# Patient Record
Sex: Female | Born: 1950 | Race: Black or African American | Hispanic: No | Marital: Married | Smoking: Never smoker
Health system: Southern US, Community
[De-identification: ages and names within clinical notes are randomized; demographics above are authoritative.]

## PROBLEM LIST (undated history)

## (undated) DIAGNOSIS — M1990 Unspecified osteoarthritis, unspecified site: Secondary | ICD-10-CM

## (undated) DIAGNOSIS — I10 Essential (primary) hypertension: Secondary | ICD-10-CM

---

## 2005-03-18 ENCOUNTER — Ambulatory Visit: Payer: Self-pay

## 2005-03-26 ENCOUNTER — Ambulatory Visit: Payer: Self-pay

## 2006-06-29 ENCOUNTER — Ambulatory Visit: Payer: Self-pay

## 2007-08-08 ENCOUNTER — Ambulatory Visit: Payer: Self-pay | Admitting: Internal Medicine

## 2007-08-30 ENCOUNTER — Ambulatory Visit: Payer: Self-pay | Admitting: Internal Medicine

## 2008-10-09 ENCOUNTER — Emergency Department: Payer: Self-pay | Admitting: Unknown Physician Specialty

## 2008-10-25 ENCOUNTER — Ambulatory Visit: Payer: Self-pay | Admitting: Internal Medicine

## 2008-12-25 ENCOUNTER — Ambulatory Visit: Payer: Self-pay | Admitting: Rheumatology

## 2009-12-17 ENCOUNTER — Ambulatory Visit: Payer: Self-pay | Admitting: Family Medicine

## 2010-02-10 ENCOUNTER — Ambulatory Visit: Payer: Self-pay | Admitting: Family Medicine

## 2010-12-31 ENCOUNTER — Ambulatory Visit: Payer: Self-pay | Admitting: Family Medicine

## 2012-10-07 ENCOUNTER — Ambulatory Visit: Payer: Self-pay | Admitting: Family Medicine

## 2014-07-30 ENCOUNTER — Other Ambulatory Visit: Payer: Self-pay | Admitting: Family Medicine

## 2014-07-30 ENCOUNTER — Other Ambulatory Visit: Payer: Self-pay

## 2014-07-30 DIAGNOSIS — Z1231 Encounter for screening mammogram for malignant neoplasm of breast: Secondary | ICD-10-CM

## 2014-08-28 ENCOUNTER — Ambulatory Visit
Admission: RE | Admit: 2014-08-28 | Discharge: 2014-08-28 | Disposition: A | Discharge status: Home / Self Care | Payer: No Typology Code available for payment source | Source: Ambulatory Visit

## 2014-08-28 DIAGNOSIS — Z1231 Encounter for screening mammogram for malignant neoplasm of breast: Secondary | ICD-10-CM | POA: Insufficient documentation

## 2014-08-29 ENCOUNTER — Other Ambulatory Visit: Payer: Self-pay | Admitting: Family Medicine

## 2016-09-15 ENCOUNTER — Ambulatory Visit
Admission: RE | Admit: 2016-09-15 | Discharge: 2016-09-15 | Disposition: A | Discharge status: Home / Self Care | Payer: Medicare HMO | Source: Ambulatory Visit | Attending: Family Medicine | Admitting: Family Medicine

## 2016-09-15 ENCOUNTER — Other Ambulatory Visit: Payer: Self-pay | Admitting: Family Medicine

## 2016-09-15 DIAGNOSIS — M7989 Other specified soft tissue disorders: Secondary | ICD-10-CM | POA: Insufficient documentation

## 2016-09-15 DIAGNOSIS — R52 Pain, unspecified: Secondary | ICD-10-CM

## 2016-09-15 DIAGNOSIS — M25571 Pain in right ankle and joints of right foot: Secondary | ICD-10-CM | POA: Insufficient documentation

## 2017-06-25 ENCOUNTER — Other Ambulatory Visit: Payer: Self-pay | Admitting: Family Medicine

## 2017-06-25 DIAGNOSIS — Z1231 Encounter for screening mammogram for malignant neoplasm of breast: Secondary | ICD-10-CM

## 2017-06-30 ENCOUNTER — Ambulatory Visit
Admission: RE | Admit: 2017-06-30 | Discharge: 2017-06-30 | Disposition: A | Discharge status: Home / Self Care | Payer: Medicare HMO | Source: Ambulatory Visit | Attending: Family Medicine | Admitting: Family Medicine

## 2017-06-30 DIAGNOSIS — Z1231 Encounter for screening mammogram for malignant neoplasm of breast: Secondary | ICD-10-CM | POA: Diagnosis present

## 2018-06-03 ENCOUNTER — Other Ambulatory Visit: Payer: Self-pay | Admitting: Family Medicine

## 2018-06-03 DIAGNOSIS — Z1231 Encounter for screening mammogram for malignant neoplasm of breast: Secondary | ICD-10-CM

## 2018-07-04 ENCOUNTER — Ambulatory Visit: Payer: Medicare HMO

## 2018-08-23 ENCOUNTER — Ambulatory Visit: Payer: Medicare HMO

## 2018-09-02 ENCOUNTER — Ambulatory Visit
Admission: RE | Admit: 2018-09-02 | Discharge: 2018-09-02 | Disposition: A | Discharge status: Home / Self Care | Payer: Medicare HMO | Source: Ambulatory Visit | Attending: Family Medicine | Admitting: Family Medicine

## 2018-09-02 ENCOUNTER — Other Ambulatory Visit: Payer: Self-pay

## 2018-09-02 DIAGNOSIS — Z1231 Encounter for screening mammogram for malignant neoplasm of breast: Secondary | ICD-10-CM | POA: Diagnosis not present

## 2018-11-12 ENCOUNTER — Other Ambulatory Visit: Payer: Self-pay

## 2018-11-12 DIAGNOSIS — Z20822 Contact with and (suspected) exposure to covid-19: Secondary | ICD-10-CM

## 2018-11-13 LAB — NOVEL CORONAVIRUS, NAA: SARS-CoV-2, NAA: NOT DETECTED

## 2019-03-29 ENCOUNTER — Ambulatory Visit: Payer: Medicare HMO | Attending: Internal Medicine

## 2019-03-29 DIAGNOSIS — Z20822 Contact with and (suspected) exposure to covid-19: Secondary | ICD-10-CM

## 2019-03-30 LAB — NOVEL CORONAVIRUS, NAA: SARS-CoV-2, NAA: NOT DETECTED

## 2019-08-22 ENCOUNTER — Other Ambulatory Visit: Payer: Self-pay | Admitting: Family Medicine

## 2019-08-22 DIAGNOSIS — Z1231 Encounter for screening mammogram for malignant neoplasm of breast: Secondary | ICD-10-CM

## 2019-09-05 ENCOUNTER — Ambulatory Visit
Admission: RE | Admit: 2019-09-05 | Discharge: 2019-09-05 | Disposition: A | Discharge status: Home / Self Care | Payer: Medicare HMO | Source: Ambulatory Visit | Attending: Family Medicine | Admitting: Family Medicine

## 2019-09-05 ENCOUNTER — Other Ambulatory Visit: Payer: Self-pay

## 2019-09-05 DIAGNOSIS — Z1231 Encounter for screening mammogram for malignant neoplasm of breast: Secondary | ICD-10-CM | POA: Diagnosis present

## 2019-12-27 HISTORY — PX: TOTAL KNEE ARTHROPLASTY: SHX125

## 2020-08-14 ENCOUNTER — Other Ambulatory Visit: Payer: Self-pay | Admitting: Family Medicine

## 2020-08-14 DIAGNOSIS — Z1231 Encounter for screening mammogram for malignant neoplasm of breast: Secondary | ICD-10-CM

## 2020-09-05 ENCOUNTER — Ambulatory Visit
Admission: RE | Admit: 2020-09-05 | Discharge: 2020-09-05 | Disposition: A | Discharge status: Home / Self Care | Payer: Medicare HMO | Source: Ambulatory Visit | Attending: Family Medicine | Admitting: Family Medicine

## 2020-09-05 ENCOUNTER — Other Ambulatory Visit: Payer: Self-pay

## 2020-09-05 DIAGNOSIS — Z1231 Encounter for screening mammogram for malignant neoplasm of breast: Secondary | ICD-10-CM | POA: Insufficient documentation

## 2020-10-08 ENCOUNTER — Other Ambulatory Visit: Payer: Self-pay

## 2020-10-29 DIAGNOSIS — D649 Anemia, unspecified: Secondary | ICD-10-CM | POA: Insufficient documentation

## 2020-10-29 DIAGNOSIS — K219 Gastro-esophageal reflux disease without esophagitis: Secondary | ICD-10-CM | POA: Insufficient documentation

## 2020-10-29 DIAGNOSIS — Z9851 Tubal ligation status: Secondary | ICD-10-CM | POA: Insufficient documentation

## 2020-10-29 DIAGNOSIS — I10 Essential (primary) hypertension: Secondary | ICD-10-CM | POA: Insufficient documentation

## 2020-10-30 ENCOUNTER — Telehealth (INDEPENDENT_AMBULATORY_CARE_PROVIDER_SITE_OTHER): Payer: Self-pay | Admitting: Gastroenterology

## 2020-10-30 DIAGNOSIS — Z1211 Encounter for screening for malignant neoplasm of colon: Secondary | ICD-10-CM

## 2020-10-30 MED ORDER — PEG 3350-KCL-NA BICARB-NACL 420 G PO SOLR: 4000.0000 mL | Freq: Once | ORAL | 0 refills | Status: AC

## 2020-10-30 NOTE — Progress Notes (Signed)
Gastroenterology Pre-Procedure Review  Request Date: 11/21/2020 Requesting Physician: Dr. Marius Ditch  PATIENT REVIEW QUESTIONS: The patient responded to the following health history questions as indicated:    1. Are you having any GI issues? no 2. Do you have a personal history of Polyps? Unsure  3. Do you have a family history of Colon Cancer or Polyps? no 4. Diabetes Mellitus? yes ( ) 5. Joint replacements in the past 12 months?yes right knee  6. Major health problems in the past 3 months?no 7. Any artificial heart valves, MVP, or defibrillator?no    MEDICATIONS & ALLERGIES:    Patient reports the following regarding taking any anticoagulation/antiplatelet therapy:   Plavix, Coumadin, Eliquis, Xarelto, Lovenox, Pradaxa, Brilinta, or Effient? yes (xarelto 10 mg ) Aspirin? no  Patient confirms/reports the following medications:  Current Outpatient Medications  Medication Sig Dispense Refill   chlorthalidone (HYGROTON) 50 MG tablet 50 mg in the morning and at bedtime.     erythromycin ophthalmic ointment erythromycin 5 mg/gram (0.5 %) eye ointment  INSTILL OINTMENT INTO RIGHT EYE ONCE DAILY FOR 7 DAYS     famotidine (PEPCID) 20 MG tablet famotidine 20 mg tablet  TAKE 1 TABLET BY MOUTH TWICE DAILY     meloxicam (MOBIC) 15 MG tablet meloxicam 15 mg tablet     ondansetron (ZOFRAN-ODT) 4 MG disintegrating tablet ondansetron 4 mg disintegrating tablet  DISSOLVE 1 TABLET IN MOUTH EVERY 6 HOURS AS NEEDED FOR NAUSEA AND VOMITING     oxyCODONE (OXY IR/ROXICODONE) 5 MG immediate release tablet oxycodone 5 mg tablet     rivaroxaban (XARELTO) 10 MG TABS tablet Take 10 mg by mouth once.     No current facility-administered medications for this visit.    Patient confirms/reports the following allergies:  Allergies  Allergen Reactions   Aspirin Anaphylaxis   Penicillins     No orders of the defined types were placed in this encounter.   AUTHORIZATION INFORMATION Primary  Insurance: 1D#: Group #:  Secondary Insurance: 1D#: Group #:  SCHEDULE INFORMATION: Date: 11/21/2020 Time: Location:[facility]

## 2020-10-30 NOTE — Progress Notes (Deleted)
scre

## 2020-11-01 ENCOUNTER — Telehealth: Payer: Self-pay

## 2020-11-01 NOTE — Telephone Encounter (Signed)
Contacted patient regarding the blood thinner request. Per Dr. Clemmie Krill, patient can stop taking Xarelto 2 days prior to procedure and restart 1 day after. Pt verbalized understanding.

## 2020-11-13 ENCOUNTER — Encounter: Payer: Self-pay | Admitting: Gastroenterology

## 2020-11-21 ENCOUNTER — Ambulatory Visit
Admission: RE | Admit: 2020-11-21 | Discharge: 2020-11-21 | Disposition: A | Discharge status: Home / Self Care | Payer: Medicare HMO | Attending: Gastroenterology | Admitting: Gastroenterology

## 2020-11-21 ENCOUNTER — Encounter: Payer: Self-pay | Admitting: Gastroenterology

## 2020-11-21 ENCOUNTER — Other Ambulatory Visit: Payer: Self-pay

## 2020-11-21 ENCOUNTER — Encounter
Admission: RE | Disposition: A | Discharge status: Home / Self Care | Payer: Self-pay | Source: Home / Self Care | Attending: Gastroenterology

## 2020-11-21 ENCOUNTER — Ambulatory Visit: Payer: Medicare HMO | Admitting: Anesthesiology

## 2020-11-21 DIAGNOSIS — Z87892 Personal history of anaphylaxis: Secondary | ICD-10-CM | POA: Diagnosis not present

## 2020-11-21 DIAGNOSIS — Z880 Allergy status to penicillin: Secondary | ICD-10-CM | POA: Insufficient documentation

## 2020-11-21 DIAGNOSIS — Z7982 Long term (current) use of aspirin: Secondary | ICD-10-CM | POA: Insufficient documentation

## 2020-11-21 DIAGNOSIS — Z791 Long term (current) use of non-steroidal anti-inflammatories (NSAID): Secondary | ICD-10-CM | POA: Insufficient documentation

## 2020-11-21 DIAGNOSIS — D124 Benign neoplasm of descending colon: Secondary | ICD-10-CM | POA: Insufficient documentation

## 2020-11-21 DIAGNOSIS — K644 Residual hemorrhoidal skin tags: Secondary | ICD-10-CM | POA: Insufficient documentation

## 2020-11-21 DIAGNOSIS — Z79899 Other long term (current) drug therapy: Secondary | ICD-10-CM | POA: Insufficient documentation

## 2020-11-21 DIAGNOSIS — K635 Polyp of colon: Secondary | ICD-10-CM | POA: Diagnosis not present

## 2020-11-21 DIAGNOSIS — Z886 Allergy status to analgesic agent status: Secondary | ICD-10-CM | POA: Insufficient documentation

## 2020-11-21 DIAGNOSIS — Z1211 Encounter for screening for malignant neoplasm of colon: Secondary | ICD-10-CM | POA: Insufficient documentation

## 2020-11-21 HISTORY — DX: Unspecified osteoarthritis, unspecified site: M19.90

## 2020-11-21 HISTORY — PX: COLONOSCOPY WITH PROPOFOL: SHX5780

## 2020-11-21 HISTORY — DX: Essential (primary) hypertension: I10

## 2020-11-21 SURGERY — COLONOSCOPY WITH PROPOFOL
Anesthesia: General

## 2020-11-21 MED ORDER — SODIUM CHLORIDE 0.9 % IV SOLN: INTRAVENOUS | Status: DC

## 2020-11-21 MED ORDER — STERILE WATER FOR IRRIGATION IR SOLN: Status: DC | PRN

## 2020-11-21 MED ORDER — PROPOFOL 10 MG/ML IV BOLUS
INTRAVENOUS | Status: DC | PRN
  Administered 2020-11-21: 40 mg via INTRAVENOUS
  Administered 2020-11-21: 120 mg via INTRAVENOUS
  Administered 2020-11-21 (×3): 40 mg via INTRAVENOUS

## 2020-11-21 MED ORDER — LIDOCAINE HCL (CARDIAC) PF 100 MG/5ML IV SOSY
PREFILLED_SYRINGE | INTRAVENOUS | Status: DC | PRN
  Administered 2020-11-21: 30 mg via INTRAVENOUS

## 2020-11-21 MED ORDER — ACETAMINOPHEN 325 MG PO TABS: 325.0000 mg | ORAL_TABLET | ORAL | Status: DC | PRN

## 2020-11-21 MED ORDER — LACTATED RINGERS IV SOLN: INTRAVENOUS | Status: DC

## 2020-11-21 MED ORDER — ACETAMINOPHEN 160 MG/5ML PO SOLN: 325.0000 mg | ORAL | Status: DC | PRN

## 2020-11-21 MED ORDER — ONDANSETRON HCL 4 MG/2ML IJ SOLN: 4.0000 mg | Freq: Once | INTRAMUSCULAR | Status: DC | PRN

## 2020-11-21 SURGICAL SUPPLY — 26 items
CLIP HMST 235XBRD CATH ROT (MISCELLANEOUS) IMPLANT
CLIP RESOLUTION 360 11X235 (MISCELLANEOUS)
ELECT REM PT RETURN 9FT ADLT (ELECTROSURGICAL)
ELECTRODE REM PT RTRN 9FT ADLT (ELECTROSURGICAL) IMPLANT
FCP ESCP3.2XJMB 240X2.8X (MISCELLANEOUS)
FORCEPS BIOP RAD 4 LRG CAP 4 (CUTTING FORCEPS) IMPLANT
FORCEPS BIOP RJ4 240 W/NDL (MISCELLANEOUS)
FORCEPS ESCP3.2XJMB 240X2.8X (MISCELLANEOUS) IMPLANT
GOWN CVR UNV OPN BCK APRN NK (MISCELLANEOUS) ×2 IMPLANT
GOWN ISOL THUMB LOOP REG UNIV (MISCELLANEOUS) ×4
INJECTOR VARIJECT VIN23 (MISCELLANEOUS) IMPLANT
KIT DEFENDO VALVE AND CONN (KITS) IMPLANT
KIT PRC NS LF DISP ENDO (KITS) ×1 IMPLANT
KIT PROCEDURE OLYMPUS (KITS) ×2
MANIFOLD NEPTUNE II (INSTRUMENTS) ×2 IMPLANT
MARKER SPOT ENDO TATTOO 5ML (MISCELLANEOUS) IMPLANT
PROBE APC STR FIRE (PROBE) IMPLANT
RETRIEVER NET ROTH 2.5X230 LF (MISCELLANEOUS) IMPLANT
SNARE COLD EXACTO (MISCELLANEOUS) IMPLANT
SNARE SHORT THROW 13M SML OVAL (MISCELLANEOUS) IMPLANT
SNARE SHORT THROW 30M LRG OVAL (MISCELLANEOUS) IMPLANT
SNARE SNG USE RND 15MM (INSTRUMENTS) IMPLANT
SPOT EX ENDOSCOPIC TATTOO (MISCELLANEOUS)
TRAP ETRAP POLY (MISCELLANEOUS) IMPLANT
VARIJECT INJECTOR VIN23 (MISCELLANEOUS)
WATER STERILE IRR 250ML POUR (IV SOLUTION) ×2 IMPLANT

## 2020-11-21 NOTE — Anesthesia Procedure Notes (Signed)
Date/Time: 11/21/2020 11:40 AM Performed by: Cameron Ali, CRNA Pre-anesthesia Checklist: Patient identified, Emergency Drugs available, Suction available, Timeout performed and Patient being monitored Patient Re-evaluated:Patient Re-evaluated prior to induction Oxygen Delivery Method: Nasal cannula Placement Confirmation: positive ETCO2

## 2020-11-21 NOTE — Transfer of Care (Signed)
Immediate Anesthesia Transfer of Care Note  Patient: Taylor Bender  Procedure(s) Performed: COLONOSCOPY WITH PROPOFOL  Patient Location: PACU  Anesthesia Type: General  Level of Consciousness: awake, alert  and patient cooperative  Airway and Oxygen Therapy: Patient Spontanous Breathing and Patient connected to supplemental oxygen  Post-op Assessment: Post-op Vital signs reviewed, Patient's Cardiovascular Status Stable, Respiratory Function Stable, Patent Airway and No signs of Nausea or vomiting  Post-op Vital Signs: Reviewed and stable  Complications: No notable events documented.

## 2020-11-21 NOTE — Anesthesia Postprocedure Evaluation (Signed)
Anesthesia Post Note  Patient: Taylor Bender  Procedure(s) Performed: COLONOSCOPY WITH PROPOFOL     Patient location during evaluation: PACU Anesthesia Type: General Level of consciousness: awake Pain management: pain level controlled Vital Signs Assessment: post-procedure vital signs reviewed and stable Respiratory status: respiratory function stable Cardiovascular status: stable Postop Assessment: no signs of nausea or vomiting Anesthetic complications: no   No notable events documented.  Veda Canning

## 2020-11-21 NOTE — Anesthesia Preprocedure Evaluation (Signed)
Anesthesia Evaluation  Patient identified by MRN, date of birth, ID band Patient awake    Reviewed: Allergy & Precautions, NPO status   Airway Mallampati: II  TM Distance: >3 FB     Dental   Pulmonary    Pulmonary exam normal        Cardiovascular hypertension,  Rhythm:Regular Rate:Normal     Neuro/Psych    GI/Hepatic GERD  ,  Endo/Other  Obesity - BMI > 30  Renal/GU      Musculoskeletal  (+) Arthritis ,   Abdominal   Peds  Hematology   Anesthesia Other Findings   Reproductive/Obstetrics                             Anesthesia Physical Anesthesia Plan  ASA: 2  Anesthesia Plan: General   Post-op Pain Management:    Induction: Intravenous  PONV Risk Score and Plan: Propofol infusion, TIVA and Treatment may vary due to age or medical condition  Airway Management Planned: Natural Airway and Nasal Cannula  Additional Equipment:   Intra-op Plan:   Post-operative Plan:   Informed Consent: I have reviewed the patients History and Physical, chart, labs and discussed the procedure including the risks, benefits and alternatives for the proposed anesthesia with the patient or authorized representative who has indicated his/her understanding and acceptance.       Plan Discussed with: CRNA  Anesthesia Plan Comments:         Anesthesia Quick Evaluation

## 2020-11-21 NOTE — Op Note (Signed)
[facility] Gastroenterology Patient Name: Taylor Bender Procedure Date: 11/21/2020 11:22 AM MRN: [id number] Account #: [id number] Date of Birth: 02-22-1951 Admit Type: Outpatient Age: 70 Room: [facility] OR ROOM 01 Gender: Female Note Status: Finalized Procedure:             Colonoscopy Indications:           Screening for colorectal malignant neoplasm Providers:             Lin Landsman MD, MD Referring MD:          Lynnell Jude (Referring MD) Medicines:             General Anesthesia Complications:         No immediate complications. Estimated blood loss: None. Procedure:             Pre-Anesthesia Assessment:                        - Prior to the procedure, a History and Physical was                         performed, and patient medications and allergies were                         reviewed. The patient is competent. The risks and                         benefits of the procedure and the sedation options and                         risks were discussed with the patient. All questions                         were answered and informed consent was obtained.                         Patient identification and proposed procedure were                         verified by the physician, the nurse, the                         anesthesiologist, the anesthetist and the technician                         in the pre-procedure area in the procedure room in the                         endoscopy suite. Mental Status Examination: alert and                         oriented. Airway Examination: normal oropharyngeal                         airway and neck mobility. Respiratory Examination:                         clear to auscultation. CV Examination: normal.  Prophylactic Antibiotics: The patient does not require                         prophylactic antibiotics. Prior Anticoagulants: The                         patient has taken no previous anticoagulant  or                         antiplatelet agents. ASA Grade Assessment: II - A                         patient with mild systemic disease. After reviewing                         the risks and benefits, the patient was deemed in                         satisfactory condition to undergo the procedure. The                         anesthesia plan was to use general anesthesia.                         Immediately prior to administration of medications,                         the patient was re-assessed for adequacy to receive                         sedatives. The heart rate, respiratory rate, oxygen                         saturations, blood pressure, adequacy of pulmonary                         ventilation, and response to care were monitored                         throughout the procedure. The physical status of the                         patient was re-assessed after the procedure.                        After obtaining informed consent, the colonoscope was                         passed under direct vision. Throughout the procedure,                         the patient's blood pressure, pulse, and oxygen                         saturations were monitored continuously. The                         Colonoscope was introduced through the anus and  advanced to the the cecum, identified by appendiceal                         orifice and ileocecal valve. The colonoscopy was                         performed with moderate difficulty due to significant                         looping. Successful completion of the procedure was                         aided by applying abdominal pressure. The patient                         tolerated the procedure well. The quality of the bowel                         preparation was evaluated using the BBPS [facility] Bowel                         Preparation Scale) with scores of: Right Colon = 3,                         Transverse Colon = 3  and Left Colon = 3 (entire mucosa                         seen well with no residual staining, small fragments                         of stool or opaque liquid). The total BBPS score                         equals 9. Findings:      The perianal and digital rectal examinations were normal. Pertinent       negatives include normal sphincter tone and no palpable rectal lesions.      A 3 mm polyp was found in the descending colon. The polyp was sessile.       The polyp was removed with a cold snare. Resection and retrieval were       complete.      External hemorrhoids were found during retroflexion. The hemorrhoids       were small.      The exam was otherwise without abnormality. Impression:            - One 3 mm polyp in the descending colon, removed with                         a cold snare. Resected and retrieved.                        - External hemorrhoids.                        - The examination was otherwise normal. Recommendation:        - Discharge patient to home (with escort).                        -  Resume previous diet today.                        - Continue present medications.                        - Await pathology results.                        - Repeat colonoscopy in 7-10 years for surveillance                         based on pathology results. Procedure Code(s):     --- Professional ---                        [phone], Colonoscopy, flexible; with removal of                         tumor(s), polyp(s), or other lesion(s) by snare                         technique Diagnosis Code(s):     --- Professional ---                        Z12.11, Encounter for screening for malignant neoplasm                         of colon                        K63.5, Polyp of colon                        K64.4, Residual hemorrhoidal skin tags CPT copyright 2019 American Medical Association. All rights reserved. The codes documented in this report are preliminary and upon coder review  may  be revised to meet current compliance requirements. Dr. Ulyess Mort Lin Landsman MD, MD 11/21/2020 11:54:02 AM This report has been signed electronically. Number of Addenda: 0 Note Initiated On: 11/21/2020 11:22 AM Scope Withdrawal Time: 0 hours 10 minutes 56 seconds  Total Procedure Duration: 0 hours 18 minutes 27 seconds  Estimated Blood Loss:  Estimated blood loss: none.      [facility]

## 2020-11-21 NOTE — H&P (Signed)
Cephas Darby, MD [address]  [place]  [place], [place] [address]  Main: [phone]  Fax: [phone] Pager: [phone]  Primary Care Physician:  Lynnell Jude, MD Primary Gastroenterologist:  Dr. Cephas Darby  Pre-Procedure History & Physical: HPI:  Taylor Bender is a 70 y.o. female is here for an colonoscopy.   Past Medical History:  Diagnosis Date   Arthritis    Hypertension     Past Surgical History:  Procedure Laterality Date   TOTAL KNEE ARTHROPLASTY Right 12/27/2019    Prior to Admission medications   Medication Sig Start Date End Date Taking? Authorizing Provider  aspirin EC 81 MG tablet Take 81 mg by mouth daily. Swallow whole.   Yes [provider]  chlorthalidone (HYGROTON) 50 MG tablet 50 mg in the morning and at bedtime. (Pt takes both tabs in the AM)   Yes [provider]  Ferrous Sulfate (IRON PO) Take by mouth.   Yes [provider]  meloxicam (MOBIC) 15 MG tablet meloxicam 15 mg tablet   Yes [provider]  metoprolol (TOPROL-XL) 200 MG 24 hr tablet Take 100 mg by mouth daily.   Yes [provider]  VITAMIN D PO Take by mouth.   Yes [provider]  erythromycin ophthalmic ointment erythromycin 5 mg/gram (0.5 %) eye ointment  INSTILL OINTMENT INTO RIGHT EYE ONCE DAILY FOR 7 DAYS    [provider]  famotidine (PEPCID) 20 MG tablet famotidine 20 mg tablet  TAKE 1 TABLET BY MOUTH TWICE DAILY Patient not taking: Reported on 11/13/2020    [provider]  ondansetron (ZOFRAN-ODT) 4 MG disintegrating tablet ondansetron 4 mg disintegrating tablet  DISSOLVE 1 TABLET IN MOUTH EVERY 6 HOURS AS NEEDED FOR NAUSEA AND VOMITING Patient not taking: Reported on 11/13/2020    [provider]  oxyCODONE (OXY IR/ROXICODONE) 5 MG immediate release tablet oxycodone 5 mg tablet Patient not taking: Reported on 11/13/2020    [provider]  rivaroxaban (XARELTO) 10 MG TABS  tablet Take 10 mg by mouth once. Patient not taking: Reported on 11/21/2020    [provider]    Allergies as of 10/30/2020 - Review Complete 10/30/2020  Allergen Reaction Noted   Aspirin Anaphylaxis 12/27/2019   Penicillins  10/29/2020    Family History  Problem Relation Age of Onset   Breast cancer Mother 71    Social History   Socioeconomic History   Marital status: Married    Spouse name: Not on file   Number of children: Not on file   Years of education: Not on file   Highest education level: Not on file  Occupational History   Not on file  Tobacco Use   Smoking status: Never   Smokeless tobacco: Never  Vaping Use   Vaping Use: Never used  Substance and Sexual Activity   Alcohol use: Not Currently   Drug use: Not on file   Sexual activity: Not on file  Other Topics Concern   Not on file  Social History Narrative   Not on file   Social Determinants of Health   Financial Resource Strain: Not on file  Food Insecurity: Not on file  Transportation Needs: Not on file  Physical Activity: Not on file  Stress: Not on file  Social Connections: Not on file  Intimate Partner Violence: Not on file    Review of Systems: See HPI, otherwise negative ROS  Physical Exam: BP (!) 155/67   Pulse Marland Kitchen)  57   Temp 97.6 F (36.4 C) (Temporal)   Ht 5' 4.5" (1.638 m)   Wt 80.7 kg   SpO2 96%   BMI 30.08 kg/m  General:   Alert,  pleasant and cooperative in NAD Head:  Normocephalic and atraumatic. Neck:  Supple; no masses or thyromegaly. Lungs:  Clear throughout to auscultation.    Heart:  Regular rate and rhythm. Abdomen:  Soft, nontender and nondistended. Normal bowel sounds, without guarding, and without rebound.   Neurologic:  Alert and  oriented x4;  grossly normal neurologically.  Impression/Plan: Taylor Bender is here for an colonoscopy to be performed for colon cancer screening  Risks, benefits, limitations, and alternatives regarding  colonoscopy have  been reviewed with the patient.  Questions have been answered.  All parties agreeable.   Sherri Sear, MD  11/21/2020, 10:12 AM

## 2020-11-22 ENCOUNTER — Encounter: Payer: Self-pay | Admitting: Gastroenterology

## 2020-11-25 ENCOUNTER — Encounter: Payer: Self-pay | Admitting: Gastroenterology

## 2020-11-25 LAB — SURGICAL PATHOLOGY

## 2021-08-22 ENCOUNTER — Other Ambulatory Visit: Payer: Self-pay | Admitting: Family Medicine

## 2021-08-22 DIAGNOSIS — Z1231 Encounter for screening mammogram for malignant neoplasm of breast: Secondary | ICD-10-CM

## 2021-09-10 ENCOUNTER — Ambulatory Visit
Admission: RE | Admit: 2021-09-10 | Discharge: 2021-09-10 | Disposition: A | Discharge status: Home / Self Care | Payer: Medicare HMO | Source: Ambulatory Visit | Attending: Family Medicine | Admitting: Family Medicine

## 2021-09-10 DIAGNOSIS — Z1231 Encounter for screening mammogram for malignant neoplasm of breast: Secondary | ICD-10-CM | POA: Insufficient documentation

## 2022-05-16 IMAGING — MG MM DIGITAL SCREENING BILAT W/ TOMO AND CAD
8 series · 8 of 24 positions shown · non-contrast
Comparison: Previous exam(s).

CLINICAL DATA: Screening.

EXAM:
DIGITAL SCREENING BILATERAL MAMMOGRAM WITH TOMOSYNTHESIS AND CAD
TECHNIQUE: Bilateral screening digital craniocaudal and mediolateral oblique
mammograms were obtained. Bilateral screening digital breast
tomosynthesis was performed. The images were evaluated with
computer-aided detection.

[R CC synth-2D]
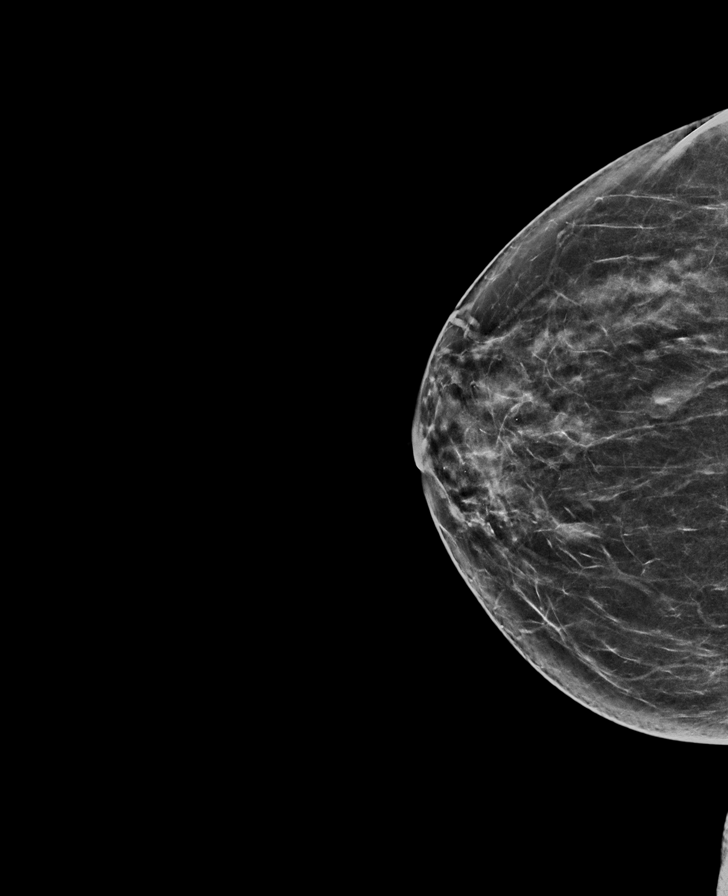

[L CC synth-2D]
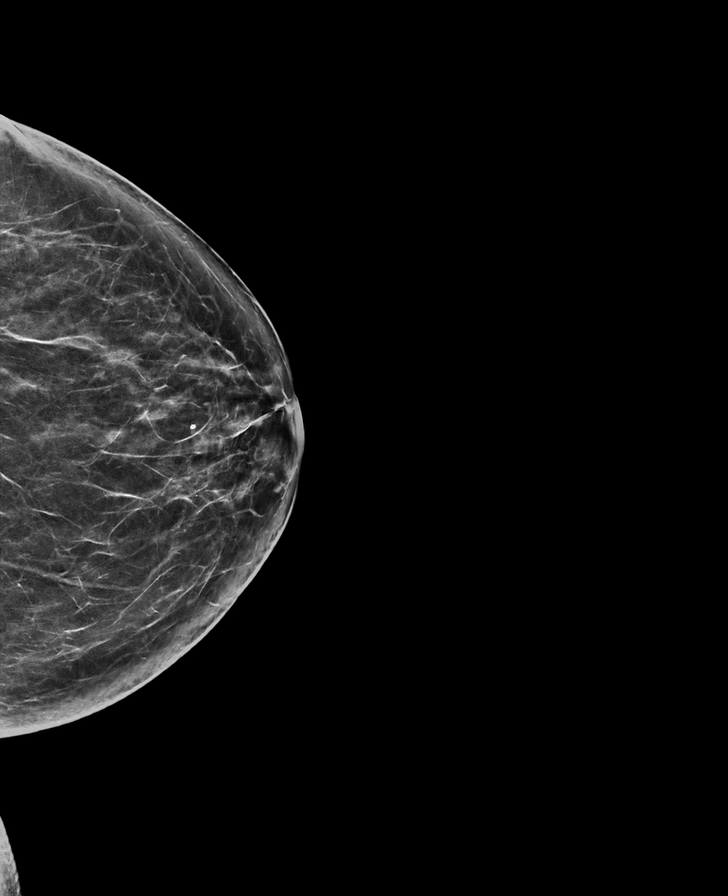

[R MLO synth-2D]
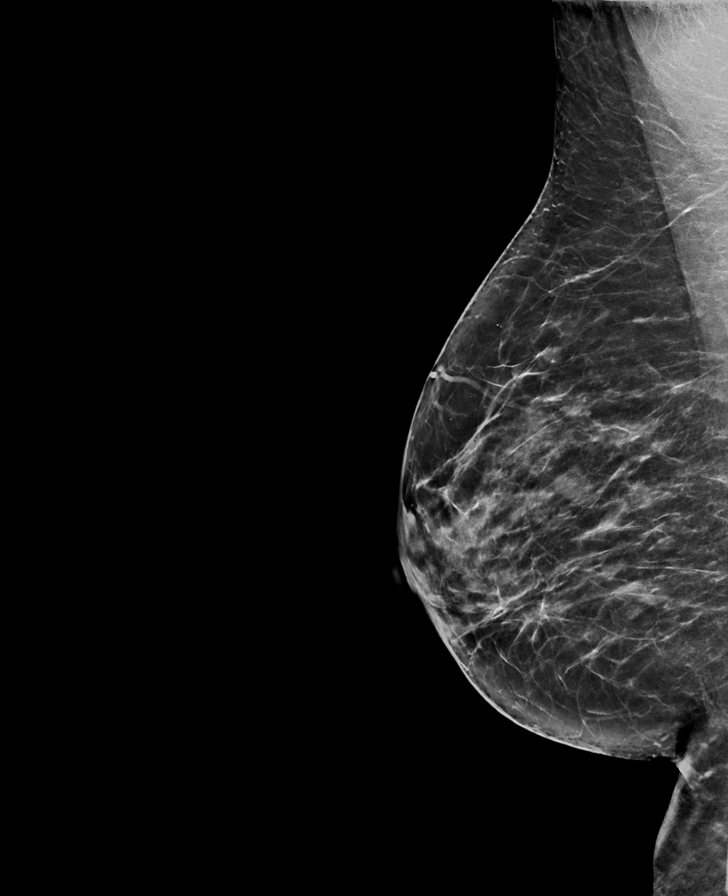

[L MLO synth-2D]
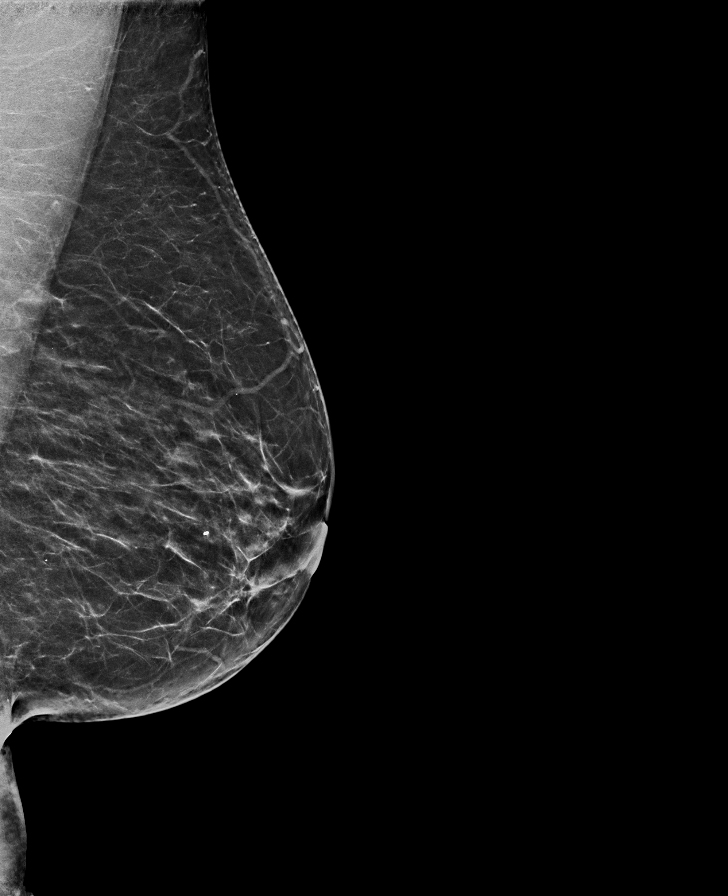

[L MLO tomo · tomo slice 35/68.0]
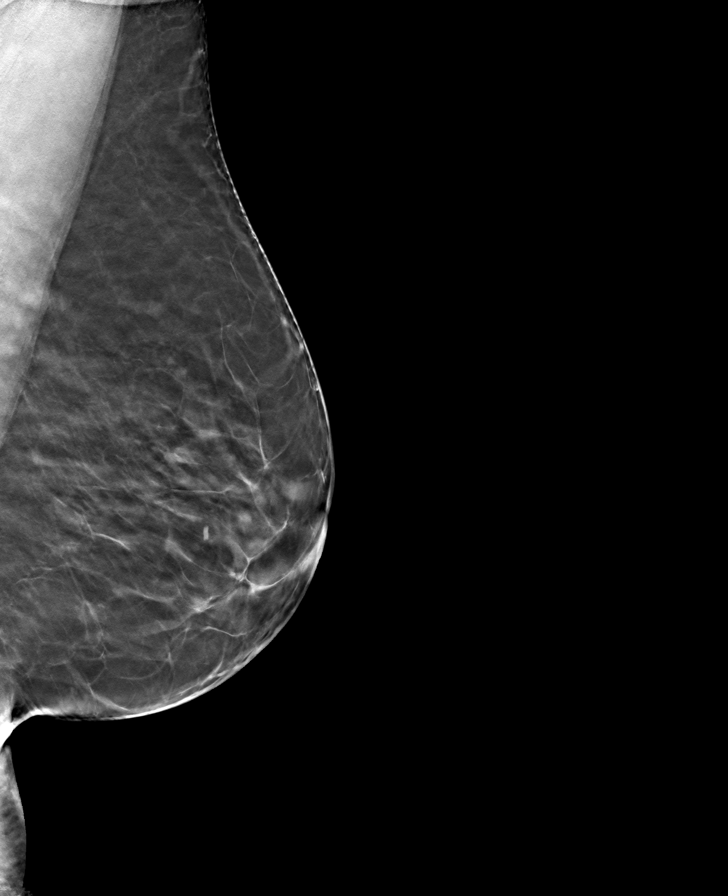

[L CC tomo · tomo slice 37/72.0]
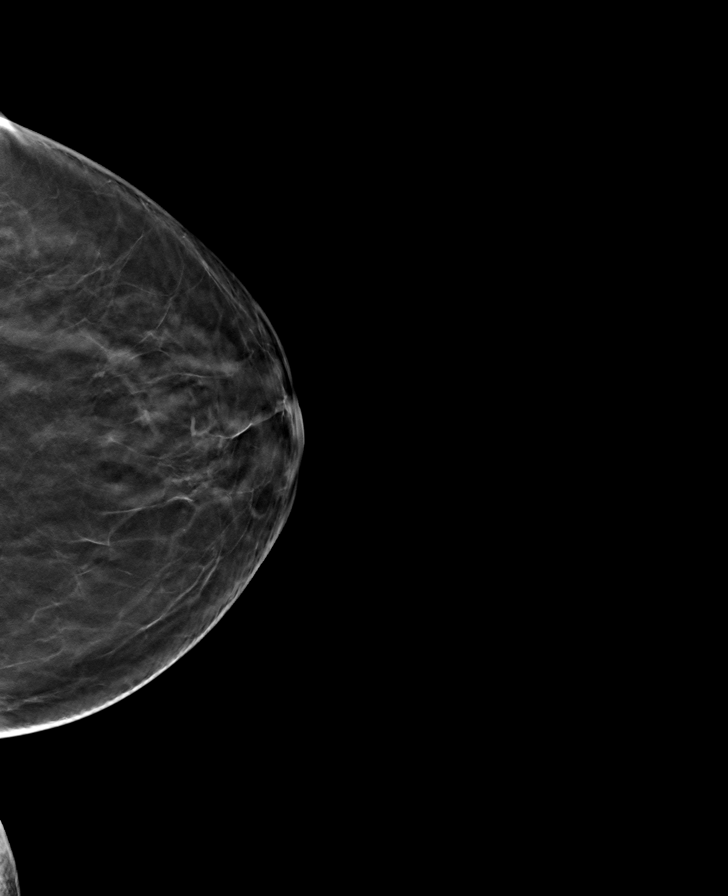

[R MLO tomo · tomo slice 35/70.0]
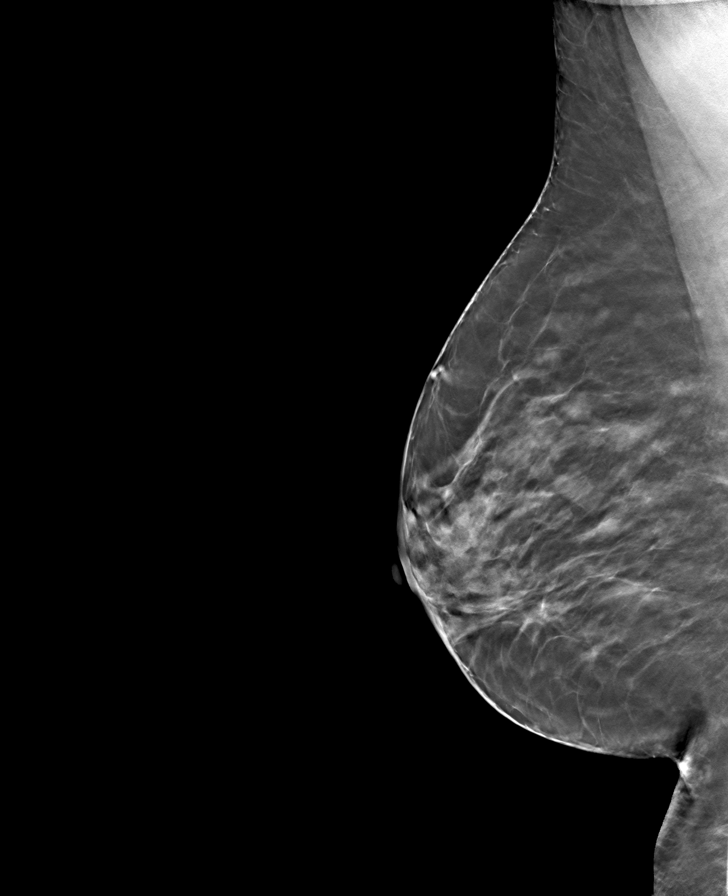

[R CC tomo · tomo slice 33/66.0]
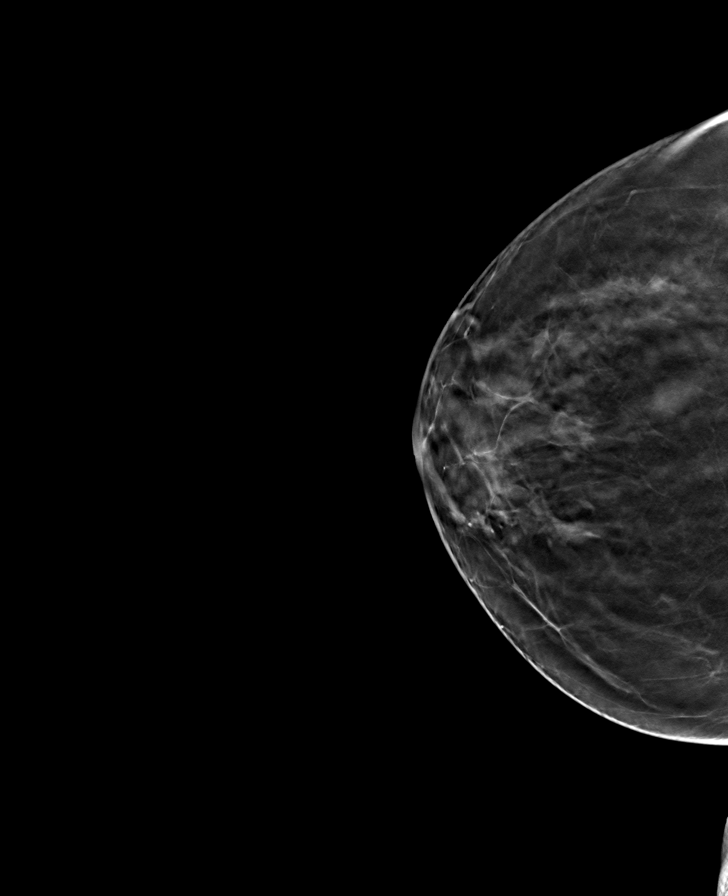

[8 of 24 positions shown; findings below may reference images not displayed]

ACR Breast Density Category b: There are scattered areas of
fibroglandular density.
FINDINGS: There are no findings suspicious for malignancy. The images were
evaluated with computer-aided detection.
IMPRESSION: No mammographic evidence of malignancy. A result letter of this
screening mammogram will be mailed directly to the patient.

RECOMMENDATION:
Screening mammogram in one year. (Code:WJ-I-BG6)

BI-RADS CATEGORY  1: Negative.

## 2022-08-04 ENCOUNTER — Other Ambulatory Visit: Payer: Self-pay | Admitting: Family Medicine

## 2022-08-04 DIAGNOSIS — Z1231 Encounter for screening mammogram for malignant neoplasm of breast: Secondary | ICD-10-CM

## 2022-09-14 ENCOUNTER — Ambulatory Visit
Admission: RE | Admit: 2022-09-14 | Discharge: 2022-09-14 | Disposition: A | Discharge status: Home / Self Care | Payer: Medicare PPO | Source: Ambulatory Visit | Attending: Family Medicine | Admitting: Family Medicine

## 2022-09-14 DIAGNOSIS — Z1231 Encounter for screening mammogram for malignant neoplasm of breast: Secondary | ICD-10-CM | POA: Insufficient documentation

## 2023-08-16 ENCOUNTER — Other Ambulatory Visit: Payer: Self-pay | Admitting: Family Medicine

## 2023-08-16 DIAGNOSIS — Z1231 Encounter for screening mammogram for malignant neoplasm of breast: Secondary | ICD-10-CM

## 2023-09-27 ENCOUNTER — Ambulatory Visit
Admission: RE | Admit: 2023-09-27 | Discharge: 2023-09-27 | Disposition: A | Discharge status: Home / Self Care | Source: Ambulatory Visit | Attending: Family Medicine | Admitting: Family Medicine

## 2023-09-27 DIAGNOSIS — Z1231 Encounter for screening mammogram for malignant neoplasm of breast: Secondary | ICD-10-CM | POA: Insufficient documentation
